# Patient Record
Sex: Male | Born: 1935 | Race: White | Hispanic: No | Marital: Married | Smoking: Former smoker
Health system: Southern US, Community
[De-identification: ages and names within clinical notes are randomized; demographics above are authoritative.]

## PROBLEM LIST (undated history)

## (undated) DIAGNOSIS — E7800 Pure hypercholesterolemia, unspecified: Secondary | ICD-10-CM

## (undated) DIAGNOSIS — I10 Essential (primary) hypertension: Secondary | ICD-10-CM

## (undated) DIAGNOSIS — E079 Disorder of thyroid, unspecified: Secondary | ICD-10-CM

## (undated) HISTORY — PX: SHOULDER SURGERY: SHX246

## (undated) HISTORY — PX: APPENDECTOMY: SHX54

---

## 2006-05-05 ENCOUNTER — Ambulatory Visit: Payer: Self-pay | Admitting: Otolaryngology

## 2006-05-05 ENCOUNTER — Other Ambulatory Visit: Payer: Self-pay

## 2006-05-06 ENCOUNTER — Ambulatory Visit: Payer: Self-pay | Admitting: Otolaryngology

## 2007-07-20 ENCOUNTER — Ambulatory Visit: Payer: Self-pay | Admitting: Gastroenterology

## 2009-05-08 ENCOUNTER — Emergency Department: Payer: Self-pay | Admitting: Emergency Medicine

## 2011-04-07 ENCOUNTER — Ambulatory Visit: Payer: Self-pay | Admitting: Internal Medicine

## 2013-02-08 ENCOUNTER — Ambulatory Visit: Payer: Self-pay | Admitting: Ophthalmology

## 2013-10-19 ENCOUNTER — Ambulatory Visit: Payer: Self-pay | Admitting: Emergency Medicine

## 2015-01-02 ENCOUNTER — Ambulatory Visit: Payer: Self-pay | Admitting: Ophthalmology

## 2015-01-15 IMAGING — CR BILATERAL SACROILIAC JOINTS - 3+ VIEW
1 series · 4 of 4 positions shown · non-contrast
Comparison: None.

CLINICAL DATA: Right posterior pelvic pain.

EXAM:
BILATERAL SACROILIAC JOINTS - 3+ VIEW

[Series 1: ap · 0.17mm/px · 4 of 4 slices shown]
[im 1/4]
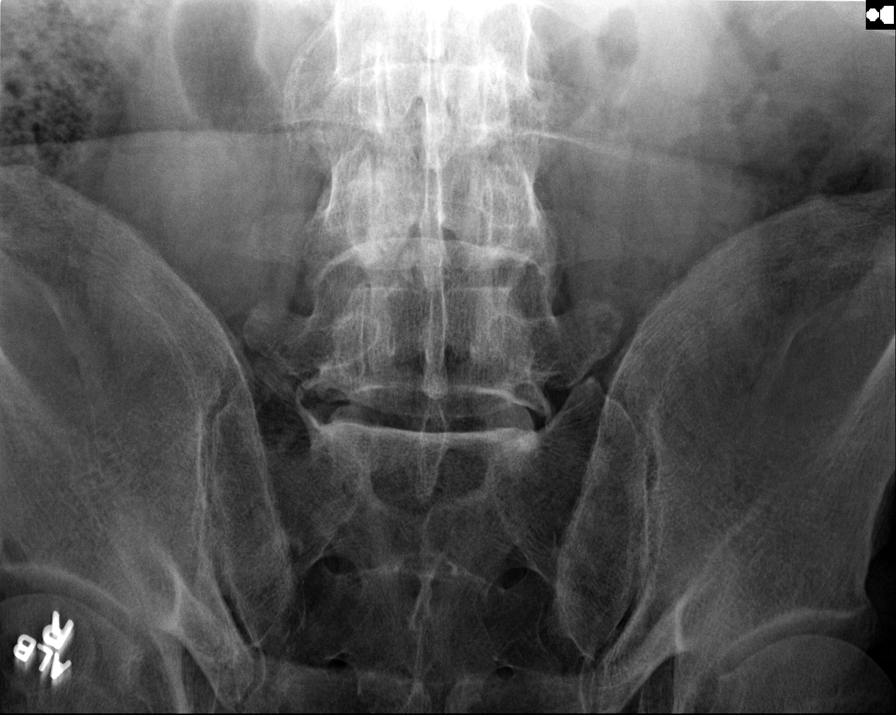
[im 2/4]
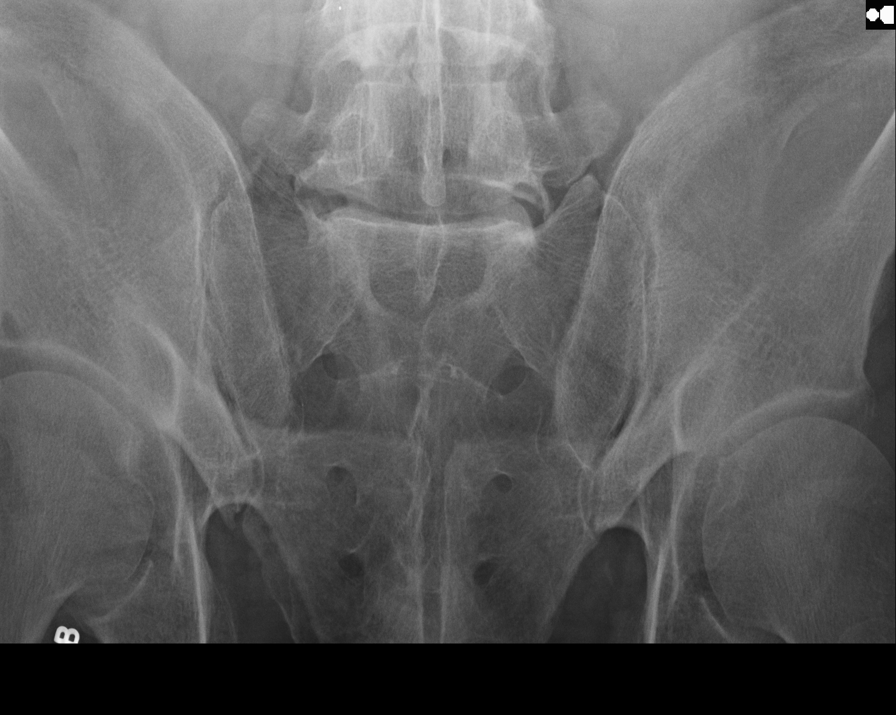
[im 3/4]
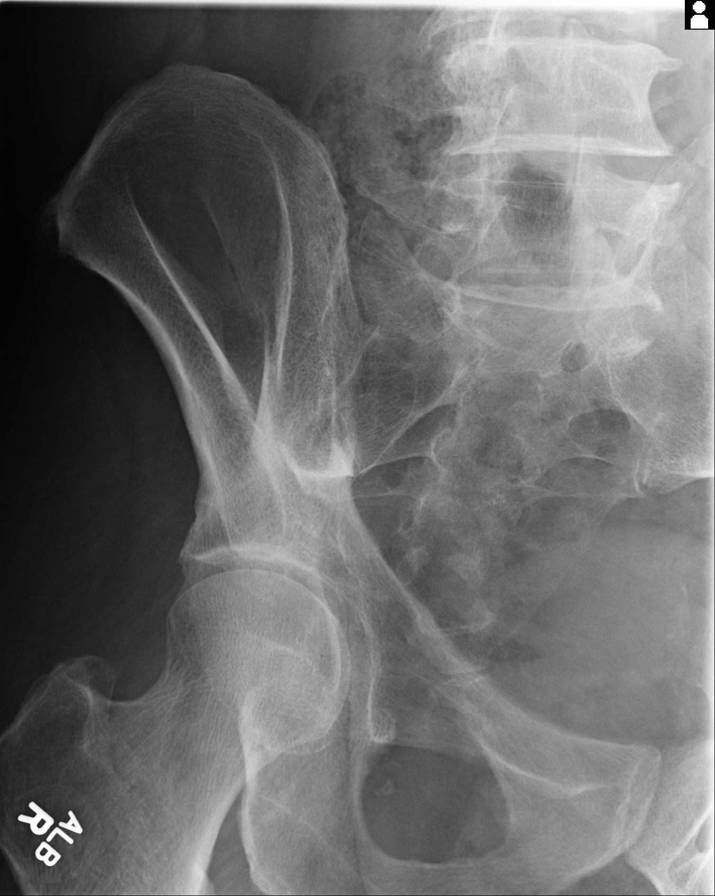
[im 4/4]
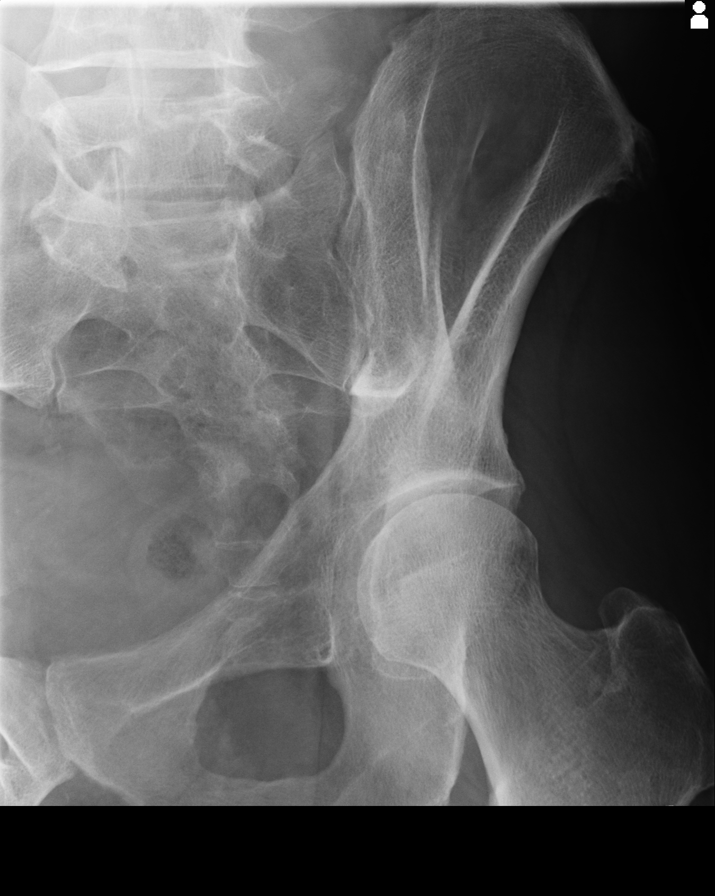

[4 of 4 positions shown; findings below may reference images not displayed]

FINDINGS: The sacroiliac joint spaces are maintained and there is no evidence
of arthropathy. No other bone abnormalities are seen.
IMPRESSION: No acute osseous injury of the SI joints.

## 2016-07-09 ENCOUNTER — Encounter: Payer: Self-pay | Admitting: *Deleted

## 2016-07-09 ENCOUNTER — Ambulatory Visit
Admission: EM | Admit: 2016-07-09 | Discharge: 2016-07-09 | Disposition: A | Discharge status: Home / Self Care | Payer: Medicare HMO | Attending: Family Medicine | Admitting: Family Medicine

## 2016-07-09 DIAGNOSIS — L255 Unspecified contact dermatitis due to plants, except food: Secondary | ICD-10-CM | POA: Diagnosis not present

## 2016-07-09 HISTORY — DX: Essential (primary) hypertension: I10

## 2016-07-09 HISTORY — DX: Pure hypercholesterolemia, unspecified: E78.00

## 2016-07-09 HISTORY — DX: Disorder of thyroid, unspecified: E07.9

## 2016-07-09 MED ORDER — PREDNISONE 10 MG (21) PO TBPK: 10.0000 mg | ORAL_TABLET | Freq: Every day | ORAL | 0 refills | Status: DC

## 2016-07-09 MED ORDER — CETIRIZINE HCL 10 MG PO CAPS: 10.0000 mg | ORAL_CAPSULE | Freq: Every day | ORAL | 0 refills | Status: DC

## 2016-07-09 NOTE — ED Triage Notes (Signed)
Patient was cutting follage 5 days ago and started having symptoms of skin rash and irritation 3 days ago. Patient thinks he came in contact with poison.

## 2016-07-09 NOTE — ED Provider Notes (Signed)
CSN: [id number]     Arrival date & time 07/09/16  0912 History   None    Chief Complaint  Patient presents with  . Poison Ivy   (Consider location/radiation/quality/duration/timing/severity/associated sxs/prior Treatment) HPI  This is a 82 year old gentleman who presents with a contact dermatitis over his arms and the right shoulder. He states that he was cutting brushes in his yard on Friday and on Saturday started having symptoms of severe itching and a rash. The rash is mostly over the extensor surfaces extending from the wrist to the elbow. Been using over-the-counter remedies but none have been very effective. He has been itching continuously. His arms are covered with calamine lotion. He's had no fever or chills.  Past Medical History:  Diagnosis Date  . Hypercholesteremia   . Hypertension   . Thyroid disease    Past Surgical History:  Procedure Laterality Date  . APPENDECTOMY    . SHOULDER SURGERY Left    History reviewed. No pertinent family history. Social History  Substance Use Topics  . Smoking status: Former Games developermoker  . Smokeless tobacco: Never Used  . Alcohol use No    Review of Systems  Constitutional: Positive for activity change. Negative for chills, fatigue and fever.  Skin: Positive for color change and rash.  All other systems reviewed and are negative.   Allergies  Review of patient's allergies indicates no known allergies.  Home Medications   Prior to Admission medications   Medication Sig Start Date End Date Taking? Authorizing Provider  amLODipine (NORVASC) 5 MG tablet Take 5 mg by mouth daily.   Yes Historical Provider, MD  aspirin 81 MG tablet Take 81 mg by mouth daily.   Yes Historical Provider, MD  levothyroxine (SYNTHROID, LEVOTHROID) 50 MCG tablet Take 50 mcg by mouth daily before breakfast.   Yes Historical Provider, MD  lovastatin (MEVACOR) 20 MG tablet Take 20 mg by mouth at bedtime.   Yes Historical Provider, MD  Cetirizine HCl 10 MG CAPS  Take 1 capsule (10 mg total) by mouth daily. 07/09/16   Lutricia FeilWilliam P Kanaya Gunnarson, PA-C  predniSONE (STERAPRED UNI-PAK 21 TAB) 10 MG (21) TBPK tablet Take 1 tablet (10 mg total) by mouth daily. Take 6 tabs by mouth daily  for 2 days, then 5 tabs for 2 days, then 4 tabs for 2 days, then 3 tabs for 2 days, 2 tabs for 2 days, then 1 tab by mouth daily for 2 days 07/09/16   Lutricia FeilWilliam P Morenike Cuff, PA-C   Meds Ordered and Administered this Visit  Medications - No data to display  BP 132/65 (BP Location: Left Arm)   Pulse 77   Temp 97.9 F (36.6 C) (Oral)   Resp 18   Ht 6\' 3"  (1.905 m)   Wt 244 lb (110.7 kg)   SpO2 100%   BMI 30.50 kg/m  No data found.   Physical Exam  Constitutional: He is oriented to person, place, and time. He appears well-developed and well-nourished. No distress.  HENT:  Head: Normocephalic and atraumatic.  Eyes: EOM are normal. Pupils are equal, round, and reactive to light.  Neck: Normal range of motion. Neck supple.  Musculoskeletal: Normal range of motion. He exhibits no edema, tenderness or deformity.  Neurological: He is alert and oriented to person, place, and time.  Skin: Skin is warm and dry. No rash noted. He is not diaphoretic.  Examination of the patient's arms shows a very thick erythematous papular rash extending up both of his extensor forearm  surfaces from about 2 fingerbreadths above the wrist to just below the elbows bilaterally. The patient was wearing gloves while he was working in the yard and short sleeve shirt which corresponds to the area of dermatitis. Has one small patch showed that is beginning on the right shoulder. The remainder of his body appears to be spared.  Psychiatric: He has a normal mood and affect. His behavior is normal. Judgment and thought content normal.  Nursing note and vitals reviewed.   Urgent Care Course   Clinical Course    Procedures (including critical care time)  Labs Review Labs Reviewed - No data to display  Imaging  Review No results found.   Visual Acuity Review  Right Eye Distance:   Left Eye Distance:   Bilateral Distance:    Right Eye Near:   Left Eye Near:    Bilateral Near:         MDM   1. Contact dermatitis due to Genus Toxicodendron    Discharge Medication List as of 07/09/2016  9:52 AM    START taking these medications   Details  Cetirizine HCl 10 MG CAPS Take 1 capsule (10 mg total) by mouth daily., Starting Thu 07/09/2016, Print    predniSONE (STERAPRED UNI-PAK 21 TAB) 10 MG (21) TBPK tablet Take 1 tablet (10 mg total) by mouth daily. Take 6 tabs by mouth daily  for 2 days, then 5 tabs for 2 days, then 4 tabs for 2 days, then 3 tabs for 2 days, 2 tabs for 2 days, then 1 tab by mouth daily for 2 days, Starting Thu 07/09/2016, Print      Plan: 1. Test/x-ray results and diagnosis reviewed with patient 2. rx as per orders; risks, benefits, potential side effects reviewed with patient 3. Recommend supportive treatment with Use of Zyrtec. The daytime and Benadryl at night to help control itching. Given him information regarding poison ivy exposure for future use. It is not improving he should follow-up with his primary care physician. 4. F/u prn if symptoms worsen or don't improve     Lutricia Feil, PA-C 07/09/16 1005

## 2018-04-08 ENCOUNTER — Encounter: Payer: Self-pay | Admitting: Emergency Medicine

## 2018-04-08 ENCOUNTER — Other Ambulatory Visit: Payer: Self-pay

## 2018-04-08 ENCOUNTER — Ambulatory Visit
Admission: EM | Admit: 2018-04-08 | Discharge: 2018-04-08 | Disposition: A | Discharge status: Home / Self Care | Payer: Medicare HMO | Attending: Family Medicine | Admitting: Family Medicine

## 2018-04-08 DIAGNOSIS — L03211 Cellulitis of face: Secondary | ICD-10-CM | POA: Diagnosis not present

## 2018-04-08 MED ORDER — CEPHALEXIN 500 MG PO CAPS: 500.0000 mg | ORAL_CAPSULE | Freq: Three times a day (TID) | ORAL | 0 refills | Status: DC

## 2018-04-08 NOTE — ED Triage Notes (Signed)
Patient has a rash on his left cheek x 2-3 days. Patient states that it itches and a little sore. Dermatology can't see patient until Monday (04/11/18)

## 2018-04-08 NOTE — ED Provider Notes (Signed)
[facility]    CSN: [id number] Arrival date & time: 04/08/18  1000     History   Chief Complaint Chief Complaint  Patient presents with  . Rash    face    HPI Todd Glenn is a 84 y.o. male.   84 yo male with a c/o left cheek rash for the past 2-3 days. States rash is red and seems to be spreading/worsening and a little bit sore. Denies any drainage, fevers or chills.   The history is provided by the patient.  Rash    Past Medical History:  Diagnosis Date  . Hypercholesteremia   . Hypertension   . Thyroid disease     There are no active problems to display for this patient.   Past Surgical History:  Procedure Laterality Date  . APPENDECTOMY    . SHOULDER SURGERY Left        Home Medications    Prior to Admission medications   Medication Sig Start Date End Date Taking? Authorizing Provider  amLODipine (NORVASC) 5 MG tablet Take 5 mg by mouth daily.   Yes [provider]  aspirin 81 MG tablet Take 81 mg by mouth daily.   Yes [provider]  Cetirizine HCl 10 MG CAPS Take 1 capsule (10 mg total) by mouth daily. 07/09/16  Yes Lutricia Feil, PA-C  levothyroxine (SYNTHROID, LEVOTHROID) 50 MCG tablet Take 50 mcg by mouth daily before breakfast.   Yes [provider]  lovastatin (MEVACOR) 20 MG tablet Take 20 mg by mouth at bedtime.   Yes [provider]  cephALEXin (KEFLEX) 500 MG capsule Take 1 capsule (500 mg total) by mouth 3 (three) times daily. 04/08/18   Payton Mccallum, MD  predniSONE (STERAPRED UNI-PAK 21 TAB) 10 MG (21) TBPK tablet Take 1 tablet (10 mg total) by mouth daily. Take 6 tabs by mouth daily  for 2 days, then 5 tabs for 2 days, then 4 tabs for 2 days, then 3 tabs for 2 days, 2 tabs for 2 days, then 1 tab by mouth daily for 2 days 07/09/16   Lutricia Feil, PA-C    Family History Family History  Problem Relation Age of Onset  . Heart failure Mother   . Osteoarthritis Mother   . Heart  attack Father     Social History Social History   Tobacco Use  . Smoking status: Former Smoker    Last attempt to quit: 04/08/1969    Years since quitting: 49.0  . Smokeless tobacco: Never Used  Substance Use Topics  . Alcohol use: No  . Drug use: No     Allergies   Patient has no known allergies.   Review of Systems Review of Systems  Skin: Positive for rash.     Physical Exam Triage Vital Signs ED Triage Vitals  Enc Vitals Group     BP 04/08/18 1016 (!) 151/61     Pulse Rate 04/08/18 1016 65     Resp 04/08/18 1016 16     Temp 04/08/18 1016 98.2 F (36.8 C)     Temp Source 04/08/18 1016 Oral     SpO2 04/08/18 1016 98 %     Weight 04/08/18 1016 242 lb (109.8 kg)     Height 04/08/18 1016  (1.905 m)     Head Circumference --      Peak Flow --      Pain Score 04/08/18 1015 0     Pain Loc --  Pain Edu? --      Excl. in GC? --    No data found.  Updated Vital Signs BP (!) 151/61 (BP Location: Left Arm)   Pulse 65   Temp 98.2 F (36.8 C) (Oral)   Resp 16   Ht  (1.905 m)   Wt 242 lb (109.8 kg)   SpO2 98%   BMI 30.25 kg/m   Visual Acuity Right Eye Distance:   Left Eye Distance:   Bilateral Distance:    Right Eye Near:   Left Eye Near:    Bilateral Near:     Physical Exam  Constitutional: He appears well-developed and well-nourished. No distress.  Skin: Rash noted. He is not diaphoretic.  3x4cm skin area on left cheek with blanchable erythema, warmth and mild tenderness; no drainage  Nursing note and vitals reviewed.    UC Treatments / Results  Labs (all labs ordered are listed, but only abnormal results are displayed) Labs Reviewed - No data to display  EKG None  Radiology No results found.  Procedures Procedures (including critical care time)  Medications Ordered in UC Medications - No data to display  Initial Impression / Assessment and Plan / UC Course  I have reviewed the triage vital signs and the nursing  notes.  Pertinent labs & imaging results that were available during my care of the patient were reviewed by me and considered in my medical decision making (see chart for details).      Final Clinical Impressions(s) / UC Diagnoses   Final diagnoses:  Facial cellulitis    ED Prescriptions    Medication Sig Dispense Auth. Provider   cephALEXin (KEFLEX) 500 MG capsule Take 1 capsule (500 mg total) by mouth 3 (three) times daily. 30 capsule Payton Mccallum, MD     1. diagnosis reviewed with patient 2. rx as per orders above; reviewed possible side effects, interactions, risks and benefits  3. Recommend supportive treatment with otc analgesics prn, warm compresses to area 4. Follow-up prn if symptoms worsen or don't improve  Controlled Substance Prescriptions [place] Controlled Substance Registry consulted? Not Applicable   Payton Mccallum, MD 04/08/18 [phone]

## 2018-11-02 ENCOUNTER — Encounter: Payer: Self-pay | Admitting: Emergency Medicine

## 2018-11-02 ENCOUNTER — Ambulatory Visit
Admission: EM | Admit: 2018-11-02 | Discharge: 2018-11-02 | Disposition: A | Discharge status: Home / Self Care | Payer: Medicare HMO | Attending: Family Medicine | Admitting: Family Medicine

## 2018-11-02 ENCOUNTER — Other Ambulatory Visit: Payer: Self-pay

## 2018-11-02 DIAGNOSIS — R05 Cough: Secondary | ICD-10-CM

## 2018-11-02 DIAGNOSIS — J069 Acute upper respiratory infection, unspecified: Secondary | ICD-10-CM | POA: Diagnosis not present

## 2018-11-02 DIAGNOSIS — B9789 Other viral agents as the cause of diseases classified elsewhere: Secondary | ICD-10-CM

## 2018-11-02 MED ORDER — BENZONATATE 200 MG PO CAPS: 200.0000 mg | ORAL_CAPSULE | Freq: Three times a day (TID) | ORAL | 0 refills | Status: DC | PRN

## 2018-11-02 MED ORDER — HYDROCOD POLST-CPM POLST ER 10-8 MG/5ML PO SUER: 5.0000 mL | Freq: Every evening | ORAL | 0 refills | Status: DC | PRN

## 2018-11-02 NOTE — ED Provider Notes (Signed)
[facility]    CSN: [id number] Arrival date & time: 11/02/18  [id number]     History   Chief Complaint Chief Complaint  Patient presents with  . Cough    HPI Todd Glenn is a 84 y.o. male.   The history is provided by the patient.  URI  Presenting symptoms: congestion, cough and rhinorrhea   Severity:  Moderate Onset quality:  Sudden Duration:  3 days Timing:  Constant Progression:  Unchanged Chronicity:  New Relieved by:  Nothing Worsened by:  Nothing Ineffective treatments:  OTC medications Associated symptoms: no wheezing   Risk factors: being elderly and sick contacts     Past Medical History:  Diagnosis Date  . Hypercholesteremia   . Hypertension   . Thyroid disease     There are no active problems to display for this patient.   Past Surgical History:  Procedure Laterality Date  . APPENDECTOMY    . SHOULDER SURGERY Left        Home Medications    Prior to Admission medications   Medication Sig Start Date End Date Taking? Authorizing Provider  amLODipine (NORVASC) 5 MG tablet Take 5 mg by mouth daily.   Yes [provider]  aspirin 81 MG tablet Take 81 mg by mouth daily.   Yes [provider]  Cetirizine HCl 10 MG CAPS Take 1 capsule (10 mg total) by mouth daily. 07/09/16  Yes Lutricia Feiloemer, William P, PA-C  levothyroxine (SYNTHROID, LEVOTHROID) 50 MCG tablet Take 50 mcg by mouth daily before breakfast.   Yes [provider]  lovastatin (MEVACOR) 20 MG tablet Take 20 mg by mouth at bedtime.   Yes [provider]  benzonatate (TESSALON) 200 MG capsule Take 1 capsule (200 mg total) by mouth 3 (three) times daily as needed. 11/02/18   Payton Mccallumonty, Nysia Dell, MD  cephALEXin (KEFLEX) 500 MG capsule Take 1 capsule (500 mg total) by mouth 3 (three) times daily. 04/08/18   Payton Mccallumonty, Shamarcus Hoheisel, MD  chlorpheniramine-HYDROcodone (TUSSIONEX PENNKINETIC ER) 10-8 MG/5ML SUER Take 5 mLs by mouth at bedtime as needed. 11/02/18   Payton Mccallumonty,  Lamerle Jabs, MD  predniSONE (STERAPRED UNI-PAK 21 TAB) 10 MG (21) TBPK tablet Take 1 tablet (10 mg total) by mouth daily. Take 6 tabs by mouth daily  for 2 days, then 5 tabs for 2 days, then 4 tabs for 2 days, then 3 tabs for 2 days, 2 tabs for 2 days, then 1 tab by mouth daily for 2 days 07/09/16   Lutricia Feiloemer, William P, PA-C    Family History Family History  Problem Relation Age of Onset  . Heart failure Mother   . Osteoarthritis Mother   . Heart attack Father     Social History Social History   Tobacco Use  . Smoking status: Former Smoker    Last attempt to quit: 04/08/1969    Years since quitting: 49.6  . Smokeless tobacco: Never Used  Substance Use Topics  . Alcohol use: No  . Drug use: No     Allergies   Patient has no known allergies.   Review of Systems Review of Systems  HENT: Positive for congestion and rhinorrhea.   Respiratory: Positive for cough. Negative for wheezing.      Physical Exam Triage Vital Signs ED Triage Vitals  Enc Vitals Group     BP 11/02/18 0824 (!) 166/73     Pulse Rate 11/02/18 0824 61     Resp 11/02/18 0824 18     Temp 11/02/18  0824 97.9 F (36.6 C)     Temp Source 11/02/18 0824 Oral     SpO2 11/02/18 0824 100 %     Weight 11/02/18 0822 248 lb (112.5 kg)     Height 11/02/18 0822 6\' 3"  (1.905 m)     Head Circumference --      Peak Flow --      Pain Score 11/02/18 0822 0     Pain Loc --      Pain Edu? --      Excl. in GC? --    No data found.  Updated Vital Signs BP (!) 166/73 (BP Location: Left Arm)   Pulse 61   Temp 97.9 F (36.6 C) (Oral)   Resp 18   Ht 6\' 3"  (1.905 m)   Wt 112.5 kg   SpO2 100%   BMI 31.00 kg/m   Visual Acuity Right Eye Distance:   Left Eye Distance:   Bilateral Distance:    Right Eye Near:   Left Eye Near:    Bilateral Near:     Physical Exam  Constitutional: He appears well-developed and well-nourished. No distress.  HENT:  Head: Normocephalic and atraumatic.  Right Ear: Tympanic membrane,  external ear and ear canal normal.  Left Ear: Tympanic membrane, external ear and ear canal normal.  Nose: Nose normal.  Mouth/Throat: Uvula is midline, oropharynx is clear and moist and mucous membranes are normal. No oropharyngeal exudate or tonsillar abscesses.  Neck: Normal range of motion. Neck supple. No tracheal deviation present. No thyromegaly present.  Cardiovascular: Normal rate, regular rhythm and normal heart sounds.  Pulmonary/Chest: Effort normal and breath sounds normal. No stridor. No respiratory distress. He has no wheezes. He has no rales. He exhibits no tenderness.  Lymphadenopathy:    He has no cervical adenopathy.  Neurological: He is alert.  Skin: Skin is warm and dry. No rash noted. He is not diaphoretic.  Nursing note and vitals reviewed.    UC Treatments / Results  Labs (all labs ordered are listed, but only abnormal results are displayed) Labs Reviewed - No data to display  EKG None  Radiology No results found.  Procedures Procedures (including critical care time)  Medications Ordered in UC Medications - No data to display  Initial Impression / Assessment and Plan / UC Course  I have reviewed the triage vital signs and the nursing notes.  Pertinent labs & imaging results that were available during my care of the patient were reviewed by me and considered in my medical decision making (see chart for details).      Final Clinical Impressions(s) / UC Diagnoses   Final diagnoses:  Viral URI with cough    ED Prescriptions    Medication Sig Dispense Auth. Provider   benzonatate (TESSALON) 200 MG capsule Take 1 capsule (200 mg total) by mouth 3 (three) times daily as needed. 30 capsule Payton Mccallum, MD   chlorpheniramine-HYDROcodone (TUSSIONEX PENNKINETIC ER) 10-8 MG/5ML SUER Take 5 mLs by mouth at bedtime as needed. 60 mL Payton Mccallum, MD     1. diagnosis reviewed with patient 2. rx as per orders above; reviewed possible side effects,  interactions, risks and benefits  3. Recommend supportive treatment with rest, fluids 4. Follow-up prn if symptoms worsen or don't improve   Controlled Substance Prescriptions [place] Controlled Substance Registry consulted? Not Applicable   Payton Mccallum, MD 11/02/18 [phone]

## 2018-11-02 NOTE — ED Triage Notes (Signed)
Patient c/o cough and nasal congestion x 3 days. Denies fever. Patient states he has been taking OTC Alka seltzer with minimal relief.

## 2021-06-14 ENCOUNTER — Ambulatory Visit
Admission: EM | Admit: 2021-06-14 | Discharge: 2021-06-14 | Disposition: A | Discharge status: Home / Self Care | Payer: Medicare HMO | Attending: Emergency Medicine | Admitting: Emergency Medicine

## 2021-06-14 ENCOUNTER — Other Ambulatory Visit: Payer: Self-pay

## 2021-06-14 ENCOUNTER — Encounter: Payer: Self-pay | Admitting: Emergency Medicine

## 2021-06-14 DIAGNOSIS — U071 COVID-19: Secondary | ICD-10-CM | POA: Diagnosis not present

## 2021-06-14 MED ORDER — BENZONATATE 100 MG PO CAPS: 200.0000 mg | ORAL_CAPSULE | Freq: Three times a day (TID) | ORAL | 0 refills | Status: DC

## 2021-06-14 MED ORDER — PAXLOVID 10 X 150 MG & 10 X 100MG PO TBPK: 2.0000 | ORAL_TABLET | Freq: Two times a day (BID) | ORAL | 0 refills | Status: AC

## 2021-06-14 MED ORDER — PROMETHAZINE-DM 6.25-15 MG/5ML PO SYRP: 5.0000 mL | ORAL_SOLUTION | Freq: Four times a day (QID) | ORAL | 0 refills | Status: DC | PRN

## 2021-06-14 MED ORDER — IPRATROPIUM BROMIDE 0.06 % NA SOLN: 2.0000 | Freq: Four times a day (QID) | NASAL | 12 refills | Status: DC

## 2021-06-14 NOTE — ED Triage Notes (Signed)
Patient c/o cough, congestion, runny nose, and headache that started yesterday.  Patient denies fevers.  Patient states that his home covid test came back positive this morning.

## 2021-06-14 NOTE — Discharge Instructions (Addendum)
You will have to quarantine for 5 days from the start of your symptoms.  After 5 days you can break quarantine if your symptoms have improved and you have not had a fever for 24 hours without taking Tylenol or ibuprofen.  Use over-the-counter Tylenol and ibuprofen as needed for body aches and fever.  Use the Tessalon Perles during the day as needed for cough and the Promethazine DM cough syrup at nighttime as will make you drowsy.  Take the Paxlovid twice daily for 5 days for treatment of COVID-19. Hold your Lovastatin while you are taking this medication for the next 5 days.  Use the Atrovent nasal spray, 2 squirts in each nostril every 6 hours, as needed for nasal congestion and runny nose.   If you develop any increased shortness of breath-especially at rest, you are unable to speak in full sentences, or is a late sign your lips are turning blue you need to go the ER for evaluation.

## 2021-06-14 NOTE — ED Provider Notes (Signed)
[facility]    CSN: [id number] Arrival date & time: 06/14/21  0936      History   Chief Complaint Chief Complaint  Patient presents with   Cough    COVID+ home test    HPI Todd Glenn is a 87 y.o. male.   HPI  87 year old male here for evaluation of respiratory complaints of been going on for 1 day and having tested positive for COVID at home.  Patient reports that yesterday he developed runny nose and nasal congestion for clear nasal discharge, headache, sore throat, & productive cough for green sputum.  Patient took an at home COVID test and he turned positive this morning.  He has been exposed to a positive COVID patient recently.  He denies any fever, ear pain or pressure, shortness of breath or wheezing, GI complaints, or body aches.  Patient has a history of high cholesterol, thyroid disease, and hypertension.  Patient had a GFR measured on 01/14/2021 and was 48 at that time.  Past Medical History:  Diagnosis Date   Hypercholesteremia    Hypertension    Thyroid disease     There are no problems to display for this patient.   Past Surgical History:  Procedure Laterality Date   APPENDECTOMY     SHOULDER SURGERY Left        Home Medications    Prior to Admission medications   Medication Sig Start Date End Date Taking? Authorizing Provider  amLODipine (NORVASC) 5 MG tablet Take 5 mg by mouth daily.   Yes [provider]  aspirin 81 MG tablet Take 81 mg by mouth daily.   Yes [provider]  benzonatate (TESSALON) 100 MG capsule Take 2 capsules (200 mg total) by mouth every 8 (eight) hours. 06/14/21  Yes Becky Augustayan, Trenna Kiely, NP  Cetirizine HCl 10 MG CAPS Take 1 capsule (10 mg total) by mouth daily. 07/09/16  Yes Ovid Curdoemer, William P, PA-C  ipratropium (ATROVENT) 0.06 % nasal spray Place 2 sprays into both nostrils 4 (four) times daily. 06/14/21  Yes Becky Augustayan, Kentravious Lipford, NP  levothyroxine (SYNTHROID, LEVOTHROID) 50 MCG tablet Take 50 mcg by mouth  daily before breakfast.   Yes [provider]  lovastatin (MEVACOR) 20 MG tablet Take 20 mg by mouth at bedtime.   Yes [provider]  nirmatrelvir/ritonavir EUA, renal dosing, (PAXLOVID) TBPK Take 2 tablets by mouth 2 (two) times daily for 5 days. Patient GFR is 48.Take nirmatrelvir (150 mg) one tablet twice daily for 5 days and ritonavir (100 mg) one tablet twice daily for 5 days. 06/14/21 06/19/21 Yes Becky Augustayan, Shakai Dolley, NP  promethazine-dextromethorphan (PROMETHAZINE-DM) 6.25-15 MG/5ML syrup Take 5 mLs by mouth 4 (four) times daily as needed. 06/14/21  Yes Becky Augustayan, Shyanna Klingel, NP    Family History Family History  Problem Relation Age of Onset   Heart failure Mother    Osteoarthritis Mother    Heart attack Father     Social History Social History   Tobacco Use   Smoking status: Former    Types: Cigarettes    Quit date: 04/08/1969    Years since quitting: 52.2   Smokeless tobacco: Never  Vaping Use   Vaping Use: Never used  Substance Use Topics   Alcohol use: No   Drug use: No     Allergies   Patient has no known allergies.   Review of Systems Review of Systems  Constitutional:  Negative for activity change, appetite change and fever.  HENT:  Positive for congestion, postnasal  drip, rhinorrhea and sore throat. Negative for ear pain.   Respiratory:  Positive for cough. Negative for shortness of breath and wheezing.   Gastrointestinal:  Negative for diarrhea, nausea and vomiting.  Musculoskeletal:  Negative for arthralgias and myalgias.  Skin:  Negative for rash.  Neurological:  Positive for headaches.  Hematological: Negative.   Psychiatric/Behavioral: Negative.      Physical Exam Triage Vital Signs ED Triage Vitals  Enc Vitals Group     BP 06/14/21 0951 (!) 104/91     Pulse Rate 06/14/21 0951 85     Resp 06/14/21 0951 16     Temp 06/14/21 0951 98.6 F (37 C)     Temp Source 06/14/21 0951 Oral     SpO2 06/14/21 0951 99 %     Weight 06/14/21 0948 238 lb  (108 kg)     Height 06/14/21 0948 6\' 3"  (1.905 m)     Head Circumference --      Peak Flow --      Pain Score 06/14/21 0948 5     Pain Loc --      Pain Edu? --      Excl. in GC? --    No data found.  Updated Vital Signs BP (!) 104/91 (BP Location: Right Arm)   Pulse 85   Temp 98.6 F (37 C) (Oral)   Resp 16   Ht 6\' 3"  (1.905 m)   Wt 238 lb (108 kg)   SpO2 99%   BMI 29.75 kg/m   Visual Acuity Right Eye Distance:   Left Eye Distance:   Bilateral Distance:    Right Eye Near:   Left Eye Near:    Bilateral Near:     Physical Exam Vitals and nursing note reviewed.  Constitutional:      General: He is not in acute distress.    Appearance: Normal appearance. He is obese. He is not ill-appearing.  HENT:     Head: Normocephalic and atraumatic.     Right Ear: Tympanic membrane, ear canal and external ear normal. There is no impacted cerumen.     Left Ear: Tympanic membrane, ear canal and external ear normal. There is no impacted cerumen.     Nose: Congestion and rhinorrhea present.     Mouth/Throat:     Mouth: Mucous membranes are moist.     Pharynx: Oropharynx is clear. No posterior oropharyngeal erythema.  Cardiovascular:     Rate and Rhythm: Normal rate and regular rhythm.     Pulses: Normal pulses.     Heart sounds: Normal heart sounds. No murmur heard.   No gallop.  Pulmonary:     Effort: Pulmonary effort is normal.     Breath sounds: Normal breath sounds. No wheezing, rhonchi or rales.  Musculoskeletal:     Cervical back: Normal range of motion and neck supple.  Lymphadenopathy:     Cervical: No cervical adenopathy.  Skin:    General: Skin is warm and dry.     Capillary Refill: Capillary refill takes less than 2 seconds.     Findings: No erythema or rash.  Neurological:     General: No focal deficit present.     Mental Status: He is alert and oriented to person, place, and time.  Psychiatric:        Mood and Affect: Mood normal.        Behavior: Behavior  normal.        Thought Content: Thought content normal.  Judgment: Judgment normal.     UC Treatments / Results  Labs (all labs ordered are listed, but only abnormal results are displayed) Labs Reviewed - No data to display  EKG   Radiology No results found.  Procedures Procedures (including critical care time)  Medications Ordered in UC Medications - No data to display  Initial Impression / Assessment and Plan / UC Course  I have reviewed the triage vital signs and the nursing notes.  Pertinent labs & imaging results that were available during my care of the patient were reviewed by me and considered in my medical decision making (see chart for details).  Patient is a very pleasant nontoxic-appearing 87 year old gentleman who tested positive for COVID at home this morning and developed respiratory symptoms last night here for further evaluation.  Patient's physical exam reveals pearly gray tympanic membranes bilaterally with a normal light reflex and clear external auditory canals.  Nasal mucosa is erythematous and edematous with clear nasal discharge.  Oropharyngeal exam reveals posterior oropharyngeal erythema with clear postnasal drip.  No cervical lymphadenopathy patient exam.  Cardiopulmonary exam reveals clear lung sounds in all fields.  Patient has a decreased GFR of 48 as of blood work in February of this year.  We will treat patient with Paxlovid following the renal dosing guidelines and will also provide supportive care with Atrovent nasal spray, Tessalon Perles, and Promethazine DM cough syrup.  Patient is taking lovastatin for his cholesterol and I have ordered him to hold that for the next 5 days while he is on the Paxlovid.  Patient verbalized understanding of same.  Patient discharged in stable condition.   Final Clinical Impressions(s) / UC Diagnoses   Final diagnoses:  COVID-19     Discharge Instructions      You will have to quarantine for 5 days from  the start of your symptoms.  After 5 days you can break quarantine if your symptoms have improved and you have not had a fever for 24 hours without taking Tylenol or ibuprofen.  Use over-the-counter Tylenol and ibuprofen as needed for body aches and fever.  Use the Tessalon Perles during the day as needed for cough and the Promethazine DM cough syrup at nighttime as will make you drowsy.  Take the Paxlovid twice daily for 5 days for treatment of COVID-19. Hold your Lovastatin while you are taking this medication for the next 5 days.  Use the Atrovent nasal spray, 2 squirts in each nostril every 6 hours, as needed for nasal congestion and runny nose.   If you develop any increased shortness of breath-especially at rest, you are unable to speak in full sentences, or is a late sign your lips are turning blue you need to go the ER for evaluation.      ED Prescriptions     Medication Sig Dispense Auth. Provider   benzonatate (TESSALON) 100 MG capsule Take 2 capsules (200 mg total) by mouth every 8 (eight) hours. 21 capsule Becky Augusta, NP   ipratropium (ATROVENT) 0.06 % nasal spray Place 2 sprays into both nostrils 4 (four) times daily. 15 mL Becky Augusta, NP   promethazine-dextromethorphan (PROMETHAZINE-DM) 6.25-15 MG/5ML syrup Take 5 mLs by mouth 4 (four) times daily as needed. 118 mL Becky Augusta, NP   nirmatrelvir/ritonavir EUA, renal dosing, (PAXLOVID) TBPK Take 2 tablets by mouth 2 (two) times daily for 5 days. Patient GFR is 48.Take nirmatrelvir (150 mg) one tablet twice daily for 5 days and ritonavir (100 mg) one tablet twice  daily for 5 days. 20 tablet Becky Augusta, NP      PDMP not reviewed this encounter.   Becky Augusta, NP 06/14/21 1019

## 2021-11-06 ENCOUNTER — Other Ambulatory Visit: Payer: Self-pay

## 2021-11-06 ENCOUNTER — Ambulatory Visit
Admission: EM | Admit: 2021-11-06 | Discharge: 2021-11-06 | Disposition: A | Discharge status: Home / Self Care | Payer: Medicare HMO | Attending: Internal Medicine | Admitting: Internal Medicine

## 2021-11-06 DIAGNOSIS — R202 Paresthesia of skin: Secondary | ICD-10-CM

## 2021-11-06 NOTE — ED Triage Notes (Addendum)
Patient presents to Urgent Care with complaints of numbness and tingling sensation to middle and index finger since this morning. No improvement.

## 2021-11-06 NOTE — Discharge Instructions (Addendum)
Wear the wrist brace for 7 days and see if this helps. If the numbness and tingling continue follow up with your primary care doctor since you may need to have nerve testing done.

## 2021-11-06 NOTE — ED Provider Notes (Signed)
[facility]    CSN: [id number] Arrival date & time: 11/06/21  1343   History   Chief Complaint Chief Complaint  Patient presents with   Numbness    To middle ring and index finger    HPI Todd Glenn is a 87 y.o. male who presents due to waking up with numbness and tingling of R middle finger. He is left hand dominant and denies using his R hand repetitively. Denies neck pain.  Has had steroid 8 weeks ago for hip bursitis.    Past Medical History:  Diagnosis Date   Hypercholesteremia    Hypertension    Thyroid disease     There are no problems to display for this patient.   Past Surgical History:  Procedure Laterality Date   APPENDECTOMY     SHOULDER SURGERY Left     Home Medications    Prior to Admission medications   Medication Sig Start Date End Date Taking? Authorizing Provider  amLODipine (NORVASC) 5 MG tablet Take 5 mg by mouth daily.    [provider]  aspirin 81 MG tablet Take 81 mg by mouth daily.    [provider]  levothyroxine (SYNTHROID, LEVOTHROID) 50 MCG tablet Take 50 mcg by mouth daily before breakfast.    [provider]  lovastatin (MEVACOR) 20 MG tablet Take 20 mg by mouth at bedtime.    [provider]    Family History Family History  Problem Relation Age of Onset   Heart failure Mother    Osteoarthritis Mother    Heart attack Father     Social History Social History   Tobacco Use   Smoking status: Former    Types: Cigarettes    Quit date: 04/08/1969    Years since quitting: 52.6   Smokeless tobacco: Never  Vaping Use   Vaping Use: Never used  Substance Use Topics   Alcohol use: No   Drug use: No     Allergies   Patient has no known allergies.   Review of Systems Review of Systems  Musculoskeletal:  Positive for arthralgias.       Has chronic hip arthralgia  Skin:  Negative for color change, pallor, rash and wound.  Neurological:  Positive for numbness. Negative  for weakness.    Physical Exam Triage Vital Signs ED Triage Vitals  Enc Vitals Group     BP 11/06/21 1402 (!) 147/61     Pulse Rate 11/06/21 1402 84     Resp 11/06/21 1402 16     Temp 11/06/21 1402 98.3 F (36.8 C)     Temp Source 11/06/21 1402 Oral     SpO2 11/06/21 1402 98 %     Weight --      Height --      Head Circumference --      Peak Flow --      Pain Score 11/06/21 1401 0     Pain Loc --      Pain Edu? --      Excl. in GC? --    No data found.  Updated Vital Signs BP (!) 147/61 (BP Location: Left Arm)   Pulse 84   Temp 98.3 F (36.8 C) (Oral)   Resp 16   SpO2 98%   Visual Acuity Right Eye Distance:   Left Eye Distance:   Bilateral Distance:    Right Eye Near:   Left Eye Near:    Bilateral Near:     Physical Exam  Vitals and nursing note reviewed.  Constitutional:      General: He is not in acute distress.    Appearance: He is not toxic-appearing.  HENT:     Right Ear: External ear normal.     Left Ear: External ear normal.  Eyes:     General: No scleral icterus.    Conjunctiva/sclera: Conjunctivae normal.  Cardiovascular:     Pulses: Normal pulses.  Pulmonary:     Effort: Pulmonary effort is normal.  Musculoskeletal:     Cervical back: Neck supple. No tenderness.  Skin:    General: Skin is warm and dry.     Capillary Refill: Capillary refill takes less than 2 seconds.     Findings: No bruising, erythema or rash.  Neurological:     Mental Status: He is alert and oriented to person, place, and time.     Sensory: No sensory deficit.     Motor: No weakness.     Gait: Gait normal.     Deep Tendon Reflexes: Reflexes normal.  Psychiatric:        Mood and Affect: Mood normal.        Behavior: Behavior normal.        Thought Content: Thought content normal.        Judgment: Judgment normal.     UC Treatments / Results  Labs (all labs ordered are listed, but only abnormal results are displayed) Labs Reviewed - No data to  display  EKG   Radiology No results found.  Procedures Procedures (including critical care time)  Medications Ordered in UC Medications - No data to display  Initial Impression / Assessment and Plan / UC Course  I have reviewed the triage vital signs and the nursing notes. Paresthesia R middle finger with normal neuro exam Pt was placed on a wrist brace and needs to Fu with PCP if not better in one week  Final Clinical Impressions(s) / UC Diagnoses   Final diagnoses:  Paresthesia     Discharge Instructions      Wear the wrist brace for 7 days and see if this helps. If the numbness and tingling continue follow up with your primary care doctor since you may need to have nerve testing done.      ED Prescriptions   None    PDMP not reviewed this encounter.   Garey Ham, PA-C 11/06/21 1516

## 2022-12-18 ENCOUNTER — Encounter: Payer: Self-pay | Admitting: Emergency Medicine

## 2022-12-18 ENCOUNTER — Ambulatory Visit
Admission: EM | Admit: 2022-12-18 | Discharge: 2022-12-18 | Disposition: A | Discharge status: Home / Self Care | Payer: Medicare PPO

## 2022-12-18 DIAGNOSIS — W540XXA Bitten by dog, initial encounter: Secondary | ICD-10-CM

## 2022-12-18 DIAGNOSIS — S61452A Open bite of left hand, initial encounter: Secondary | ICD-10-CM | POA: Diagnosis not present

## 2022-12-18 MED ORDER — AMOXICILLIN-POT CLAVULANATE 875-125 MG PO TABS: 1.0000 | ORAL_TABLET | Freq: Two times a day (BID) | ORAL | 0 refills | Status: AC

## 2022-12-18 NOTE — ED Provider Notes (Signed)
[facility]    CSN: [id number] Arrival date & time: 12/18/22  1635      History   Chief Complaint Chief Complaint  Patient presents with   Animal Bite    HPI Todd Glenn is a 89 y.o. male.   Patient presents for evaluation of dog bite occurring approximately 45 minutes ago.  Offending dog is his pet, endorses that he attempted to mess with the dog's collar while he was eating.  Has not attempted treatment.  Bleeding has subsided.  Has full range of motion of hand without numbness or tingling.  Past Medical History:  Diagnosis Date   Hypercholesteremia    Hypertension    Thyroid disease     There are no problems to display for this patient.   Past Surgical History:  Procedure Laterality Date   APPENDECTOMY     SHOULDER SURGERY Left        Home Medications    Prior to Admission medications   Medication Sig Start Date End Date Taking? Authorizing Provider  amLODipine (NORVASC) 5 MG tablet Take 5 mg by mouth daily.   Yes [provider]  amoxicillin-clavulanate (AUGMENTIN) 875-125 MG tablet Take 1 tablet by mouth every 12 (twelve) hours. 12/18/22  Yes Lowella Petties R, NP  aspirin 81 MG tablet Take 81 mg by mouth daily.   Yes [provider]  levothyroxine (SYNTHROID, LEVOTHROID) 50 MCG tablet Take 50 mcg by mouth daily before breakfast.   Yes [provider]  lovastatin (MEVACOR) 20 MG tablet Take 20 mg by mouth at bedtime.   Yes [provider]  Apoaequorin 10 MG CAPS Take 1 capsule by mouth daily.    [provider]    Family History Family History  Problem Relation Age of Onset   Heart failure Mother    Osteoarthritis Mother    Heart attack Father     Social History Social History   Tobacco Use   Smoking status: Former    Types: Cigarettes    Quit date: 04/08/1969    Years since quitting: 53.7   Smokeless tobacco: Never  Vaping Use   Vaping Use: Never used  Substance Use Topics    Alcohol use: No   Drug use: No     Allergies   Patient has no known allergies.   Review of Systems Review of Systems  Constitutional: Negative.   Respiratory: Negative.    Cardiovascular: Negative.   Skin:  Positive for wound. Negative for color change, pallor and rash.  Neurological: Negative.      Physical Exam Triage Vital Signs ED Triage Vitals  Enc Vitals Group     BP 12/18/22 1650 (!) 185/72     Pulse Rate 12/18/22 1650 73     Resp 12/18/22 1650 16     Temp 12/18/22 1650 97.9 F (36.6 C)     Temp Source 12/18/22 1650 Oral     SpO2 12/18/22 1650 96 %     Weight 12/18/22 1648 238 lb 1.6 oz (108 kg)     Height 12/18/22 1648 6\' 3"  (1.905 m)     Head Circumference --      Peak Flow --      Pain Score 12/18/22 1648 0     Pain Loc --      Pain Edu? --      Excl. in [place]? --    No data found.  Updated Vital Signs BP (!) 185/72 (BP Location: Left Arm)  Pulse 73   Temp 97.9 F (36.6 C) (Oral)   Resp 16   Ht 6\' 3"  (1.905 m)   Wt 238 lb 1.6 oz (108 kg)   SpO2 96%   BMI 29.76 kg/m   Visual Acuity Right Eye Distance:   Left Eye Distance:   Bilateral Distance:    Right Eye Near:   Left Eye Near:    Bilateral Near:     Physical Exam Constitutional:      Appearance: Normal appearance.  Eyes:     Extraocular Movements: Extraocular movements intact.  Pulmonary:     Effort: Pulmonary effort is normal.  Skin:    Comments: 1 x 2 cm avulsion laceration present to the center of the dorsum aspect of the left hand  Neurological:     Mental Status: He is alert and oriented to person, place, and time. Mental status is at baseline.      UC Treatments / Results  Labs (all labs ordered are listed, but only abnormal results are displayed) Labs Reviewed - No data to display  EKG   Radiology No results found.  Procedures Procedures (including critical care time)  Medications Ordered in UC Medications - No data to display  Initial Impression /  Assessment and Plan / UC Course  I have reviewed the triage vital signs and the nursing notes.  Pertinent labs & imaging results that were available during my care of the patient were reviewed by me and considered in my medical decision making (see chart for details).  Dog bite, left hand, initial encounter  Site cleaned wound cleanser, Bactroban applied and nonadherent dressing, as offending dog is pet up-to-date on vaccines will not need series, up-to-date on tetanus, Augmentin prescribed prophylactically to prevent infection, advised daily cleansing using diluted soapy water and covering with a nonadherent dressing if at risk for becoming contaminated, given strict precautions to follow-up with urgent care for any concerns regarding care with healing  Final Clinical Impressions(s) / UC Diagnoses   Final diagnoses:  Dog bite, hand, left, initial encounter     Discharge Instructions      Today you have been evaluated for dog bite  As the offending dog is your pet and is up-to-date on vaccinations you will not need rabies series  You received a tetanus 2 to 3 years and therefore you will not need a updated vaccination  Dog's mouth are considered dirty and therefore you have been placed on antibiotic to ensure that hand does not become infected, begin Augmentin every morning and every evening for 7 days  You may cleanse area daily using diluted soapy water, pat dry and cover with band aid if at risk for becoming contaminated  You may follow-up with this urgent care for any concerns regarding healing   ED Prescriptions     Medication Sig Dispense Auth. Provider   amoxicillin-clavulanate (AUGMENTIN) 875-125 MG tablet Take 1 tablet by mouth every 12 (twelve) hours. 14 tablet Otoniel Myhand, Leitha Schuller, NP      PDMP not reviewed this encounter.   Hans Eden, NP 12/18/22 1704

## 2022-12-18 NOTE — ED Triage Notes (Addendum)
Pt states his dog bit him on the right hand about 45 minutes ago. Bleeding controlled. He states he has a td about 3 years ago.

## 2022-12-18 NOTE — Discharge Instructions (Addendum)
Today you have been evaluated for dog bite  As the offending dog is your pet and is up-to-date on vaccinations you will not need rabies series  You received a tetanus 2 to 3 years and therefore you will not need a updated vaccination  Dog's mouth are considered dirty and therefore you have been placed on antibiotic to ensure that hand does not become infected, begin Augmentin every morning and every evening for 7 days  You may cleanse area daily using diluted soapy water, pat dry and cover with band aid if at risk for becoming contaminated  You may follow-up with this urgent care for any concerns regarding healing

## 2024-01-04 DIAGNOSIS — E039 Hypothyroidism, unspecified: Secondary | ICD-10-CM | POA: Diagnosis not present

## 2024-01-04 DIAGNOSIS — I10 Essential (primary) hypertension: Secondary | ICD-10-CM | POA: Diagnosis not present

## 2024-01-04 DIAGNOSIS — E669 Obesity, unspecified: Secondary | ICD-10-CM | POA: Diagnosis not present

## 2024-01-04 DIAGNOSIS — Z87891 Personal history of nicotine dependence: Secondary | ICD-10-CM | POA: Diagnosis not present

## 2024-01-04 DIAGNOSIS — E785 Hyperlipidemia, unspecified: Secondary | ICD-10-CM | POA: Diagnosis not present

## 2024-03-24 DIAGNOSIS — E1122 Type 2 diabetes mellitus with diabetic chronic kidney disease: Secondary | ICD-10-CM | POA: Diagnosis not present

## 2024-03-24 DIAGNOSIS — E039 Hypothyroidism, unspecified: Secondary | ICD-10-CM | POA: Diagnosis not present

## 2024-03-24 DIAGNOSIS — N183 Chronic kidney disease, stage 3 unspecified: Secondary | ICD-10-CM | POA: Diagnosis not present

## 2024-03-24 DIAGNOSIS — E7800 Pure hypercholesterolemia, unspecified: Secondary | ICD-10-CM | POA: Diagnosis not present

## 2024-03-31 DIAGNOSIS — E7800 Pure hypercholesterolemia, unspecified: Secondary | ICD-10-CM | POA: Diagnosis not present

## 2024-03-31 DIAGNOSIS — N183 Chronic kidney disease, stage 3 unspecified: Secondary | ICD-10-CM | POA: Diagnosis not present

## 2024-03-31 DIAGNOSIS — E1122 Type 2 diabetes mellitus with diabetic chronic kidney disease: Secondary | ICD-10-CM | POA: Diagnosis not present

## 2024-03-31 DIAGNOSIS — E039 Hypothyroidism, unspecified: Secondary | ICD-10-CM | POA: Diagnosis not present

## 2024-03-31 DIAGNOSIS — I10 Essential (primary) hypertension: Secondary | ICD-10-CM | POA: Diagnosis not present

## 2024-09-22 DIAGNOSIS — E1122 Type 2 diabetes mellitus with diabetic chronic kidney disease: Secondary | ICD-10-CM | POA: Diagnosis not present

## 2024-09-22 DIAGNOSIS — M7061 Trochanteric bursitis, right hip: Secondary | ICD-10-CM | POA: Diagnosis not present

## 2024-10-11 DIAGNOSIS — N183 Chronic kidney disease, stage 3 unspecified: Secondary | ICD-10-CM | POA: Diagnosis not present

## 2024-10-11 DIAGNOSIS — I10 Essential (primary) hypertension: Secondary | ICD-10-CM | POA: Diagnosis not present

## 2024-10-11 DIAGNOSIS — E7800 Pure hypercholesterolemia, unspecified: Secondary | ICD-10-CM | POA: Diagnosis not present

## 2024-10-11 DIAGNOSIS — E1122 Type 2 diabetes mellitus with diabetic chronic kidney disease: Secondary | ICD-10-CM | POA: Diagnosis not present

## 2024-10-12 DIAGNOSIS — Z23 Encounter for immunization: Secondary | ICD-10-CM | POA: Diagnosis not present

## 2024-10-12 DIAGNOSIS — E039 Hypothyroidism, unspecified: Secondary | ICD-10-CM | POA: Diagnosis not present

## 2024-10-12 DIAGNOSIS — E7800 Pure hypercholesterolemia, unspecified: Secondary | ICD-10-CM | POA: Diagnosis not present

## 2024-10-12 DIAGNOSIS — E1122 Type 2 diabetes mellitus with diabetic chronic kidney disease: Secondary | ICD-10-CM | POA: Diagnosis not present

## 2024-10-12 DIAGNOSIS — Z1331 Encounter for screening for depression: Secondary | ICD-10-CM | POA: Diagnosis not present

## 2024-10-12 DIAGNOSIS — Z0000 Encounter for general adult medical examination without abnormal findings: Secondary | ICD-10-CM | POA: Diagnosis not present

## 2024-10-12 DIAGNOSIS — I10 Essential (primary) hypertension: Secondary | ICD-10-CM | POA: Diagnosis not present
